# Patient Record
Sex: Female | Born: 1960 | Race: Black or African American | Hispanic: No | Marital: Single | Smoking: Former smoker
Health system: Southern US, Community
[De-identification: ages and names within clinical notes are randomized; demographics above are authoritative.]

## PROBLEM LIST (undated history)

## (undated) ENCOUNTER — Emergency Department: Discharge status: Absent without leave | Payer: Self-pay

## (undated) ENCOUNTER — Emergency Department: Admission: EM | Discharge status: Absent without leave | Payer: Self-pay

## (undated) DIAGNOSIS — I10 Essential (primary) hypertension: Secondary | ICD-10-CM

## (undated) HISTORY — PX: KNEE SURGERY: SHX244

## (undated) HISTORY — PX: CARPAL TUNNEL RELEASE: SHX101

---

## 2000-03-26 ENCOUNTER — Ambulatory Visit (HOSPITAL_BASED_OUTPATIENT_CLINIC_OR_DEPARTMENT_OTHER): Admission: RE | Admit: 2000-03-26 | Discharge: 2000-03-26 | Payer: Self-pay | Admitting: Orthopedic Surgery

## 2000-04-30 ENCOUNTER — Ambulatory Visit (HOSPITAL_BASED_OUTPATIENT_CLINIC_OR_DEPARTMENT_OTHER): Admission: RE | Admit: 2000-04-30 | Discharge: 2000-04-30 | Payer: Self-pay | Admitting: Orthopedic Surgery

## 2004-07-17 ENCOUNTER — Ambulatory Visit (HOSPITAL_BASED_OUTPATIENT_CLINIC_OR_DEPARTMENT_OTHER): Admission: RE | Admit: 2004-07-17 | Discharge: 2004-07-17 | Payer: Self-pay | Admitting: Orthopedic Surgery

## 2008-06-24 ENCOUNTER — Emergency Department (HOSPITAL_COMMUNITY): Admission: EM | Admit: 2008-06-24 | Discharge: 2008-06-24 | Payer: Self-pay | Admitting: Emergency Medicine

## 2010-07-03 LAB — DIFFERENTIAL
Basophils Relative: 0 % (ref 0–1)
Lymphocytes Relative: 38 % (ref 12–46)
Lymphs Abs: 3.4 10*3/uL (ref 0.7–4.0)
Monocytes Absolute: 0.5 10*3/uL (ref 0.1–1.0)
Monocytes Relative: 5 % (ref 3–12)
Neutro Abs: 4.9 10*3/uL (ref 1.7–7.7)
Neutrophils Relative %: 55 % (ref 43–77)

## 2010-07-03 LAB — POCT CARDIAC MARKERS
CKMB, poc: <1 ng/mL — ABNORMAL LOW (ref 1.0–8.0)
CKMB, poc: <1 ng/mL — ABNORMAL LOW (ref 1.0–8.0)
Myoglobin, poc: 66 ng/mL (ref 12–200)
Myoglobin, poc: 68.8 ng/mL (ref 12–200)
Troponin i, poc: <0.05 ng/mL (ref 0.00–0.09)

## 2010-07-03 LAB — CBC
Hemoglobin: 12.4 g/dL (ref 12.0–15.0)
MCHC: 33.4 g/dL (ref 30.0–36.0)
RBC: 4.43 MIL/uL (ref 3.87–5.11)
WBC: 9 10*3/uL (ref 4.0–10.5)

## 2010-07-03 LAB — BASIC METABOLIC PANEL
CO2: 27 mEq/L (ref 19–32)
Calcium: 9 mg/dL (ref 8.4–10.5)
Creatinine, Ser: 0.73 mg/dL (ref 0.4–1.2)
GFR calc Af Amer: >60 mL/min (ref >60)
Sodium: 140 mEq/L (ref 135–145)

## 2010-08-09 NOTE — Op Note (Signed)
[place]. [facility]  Patient:    Ann Dalton, Ann Dalton                      MRN: [id number] Proc. Date: 04/30/00 Adm. Date:  [id number] Attending:  Colbert Ewing                           Operative Report  PREOPERATIVE DIAGNOSIS:  Left carpal tunnel syndrome.  POSTOPERATIVE DIAGNOSIS:  Left carpal tunnel syndrome.  OPERATION PERFORMED:  Left carpal tunnel release.  SURGEON:  Loreta Ave, M.D.  ASSISTANT:  Arlys John D. Petrarca, P.A.-C.  ANESTHESIA:  IV regional.  SPECIMENS:  None.  CULTURES:  None.  COMPLICATIONS:  None.  DRESSING:  Soft compressive with bulky hand dressing and splint.  DESCRIPTION OF PROCEDURE:  The patient was brought to the operating room and placed on the operating table in supine position.  After adequate anesthesia had been obtained, left arm was prepped and draped in the usual sterile fashion.  Approached with a curved incision along the thenar eminence heading slightly ulnarward at the wrist crease.  Skin and subcutaneous tissues divided avoiding injury to the palmar branch of the median nerve.  Retinaculum over the carpal tunnel visualized and incised under direct visualization from the forearm fascia proximally to the palmar arch distally.  Median nerve inspected with typical hourglass constriction.  No other abnormalities within the carpal canal.  The wound was irrigated.  Motor branch digital branch identified and protected.  Everything completely decompressed.  Wound irrigated.  Skin closed with interrupted nylon.  Margins of the wound injected with Marcaine.  Sterile compressive dressing with bulky hand dressing and splint applied.  Anesthesia reversed.  Brought to recovery room.  Tolerated surgery well.  No complications. DD:  04/30/00 TD:  05/01/00 Job: [id number]

## 2010-08-09 NOTE — Op Note (Signed)
[place]. [facility]  Patient:    Ann Dalton, Ann Dalton                      MRN: [id number] Proc. Date: 03/26/00 Adm. Date:  [id number] Attending:  Colbert Ewing                           Operative Report  PREOPERATIVE DIAGNOSIS:  Right carpal tunnel syndrome.  POSTOPERATIVE DIAGNOSIS:  Right carpal tunnel syndrome.  OPERATION PERFORMED:  Right carpal tunnel release.  SURGEON:  Loreta Ave, M.D.  ASSISTANT:  Arlys John D. Petrarca, P.A.-C.  ANESTHESIA:  IV regional.  SPECIMENS:  None.  CULTURES:  None.  COMPLICATIONS:  None.  DRESSING:  Soft compressive with bulky hand dressing and splint.  DESCRIPTION OF PROCEDURE:  The patient was brought to the operating room and placed on the operating table in supine position. After adequate anesthesia had been obtained, the right arm was prepped and draped in the usual sterile fashion.  A curved incision along the thenar eminence heading slightly ulnarward at the wrist crease.  Skin and subcutaneous tissues divided. Retinaculum over the carpal tunnel identified and incised under direct visualization from the palmar arch distally to the forearm fascia proximally. Complete decompression of the ulnar nerve which had moderate hourglass constriction.  Thickened tenosynovium resected as well.  Motor branch, digital branches identified and decompressed, protected.  Wound irrigated.  Skin closed with interrupted nylon.  Margins of the wound injected with Marcaine. Sterile compressive dressing applied with bulky hand dressing and splint. Anesthesia reversed.  Brought to recovery room.  Tolerated surgery well.  No complications. DD:  03/26/00 TD:  03/26/00 Job: [id number]

## 2010-08-09 NOTE — Op Note (Signed)
Ann Dalton, Ann Dalton               ACCOUNT NO.:  [id number]   MEDICAL RECORD NO.:  [id number]          PATIENT TYPE:  AMB   LOCATION:  DSC                          FACILITY:  [facility]   PHYSICIAN:  Loreta Ave, M.D. DATE OF BIRTH:  1960/10/06   DATE OF PROCEDURE:  07/17/2004  DATE OF DISCHARGE:                                 OPERATIVE REPORT   PREOPERATIVE DIAGNOSIS:  Left knee bucket handle tear medial meniscus, with  a locked knee.   POSTOPERATIVE DIAGNOSIS:  Left knee bucket handle tear medial meniscus, with  a locked knee, with also some grade 3 chondromalacia, peak of the  patella  and lateral facet.  Mild focal grade 2 changes posterior aspect medial  femoral condyle.   PROCEDURE:  Left knee exam under anesthesia, arthroscopy, with partial  medial meniscectomy, chondroplasty patellofemoral joint and medial femoral  condyle.   SURGEON:  Loreta Ave, M.D.   ASSISTANT:  Genene Churn. Denton Meek.   ANESTHESIA:  General.   BLOOD LOSS:  Minimal.   SPECIMENS:  None.   CULTURES:  None.   COMPLICATIONS:  None.   DRESSINGS:  Soft compressive.   PROCEDURE:  The patient was brought to the operating room, and after  adequate anesthesia had been obtained, left leg was examined.  Noted to have  chronic duskiness and thickening of the skin and subcutaneous tissue from  below the knee down to the ankle.  Excellent vascular status of her foot.  The knee itself had a 2+ effusion, lacked full extension by 5 degrees  because of the displaced meniscus tear, but grossly stable ligaments.  Tourniquet and leg holder applied.  Leg prepped and draped in the usual  sterile fashion.  Tourniquet not employed.  Three portals were created, one  superolateral, and one in each medial and lateral parapatellar.  Inflow  catheter induced.  Knee distended.  Arthroscope introduced, and the knee  inspected.  Moderate amount of reactive synovitis throughout debrided.  Grade 2 and 3 changes lateral  patellar facet and on the peak, treated with  chondroplasty to a stable surface.  Good patellofemoral tracking and  stability.  Trochlea looked excellent.  Medial meniscus large bucket handle  tear with marked and intrameniscal tearing posterior two thirds displaced in  front of the condyle.  Not referable at all.  Resected leaving a rim of the  meniscus in the back, taper into remaining meniscus in the front.  There was  some scuffing medial femoral condyle posteromedially grade 2, treated with  chondroplasty.  Remaining articular cartilage looked good.  Cruciate  ligament was intact.  Lateral meniscus, lateral compartment was intact.  Entire knee examined, and no other findings appreciated.  Instruments and  fluid removed.  Portals and knee injected with Marcaine.  Portals closed  with 4-0 nylon.  Sterile compressive dressing applied.  Anesthesia reversed.  Brought to the recovery room.  Tolerated surgery well.  No complications.      DFM/MEDQ  D:  07/17/2004  T:  07/17/2004  Job:  [id number]

## 2011-03-20 ENCOUNTER — Emergency Department (INDEPENDENT_AMBULATORY_CARE_PROVIDER_SITE_OTHER)
Admission: EM | Admit: 2011-03-20 | Discharge: 2011-03-20 | Disposition: A | Discharge status: Home / Self Care | Payer: No Typology Code available for payment source | Source: Home / Self Care | Attending: Family Medicine | Admitting: Family Medicine

## 2011-03-20 ENCOUNTER — Emergency Department
Admit: 2011-03-20 | Discharge: 2011-03-20 | Disposition: A | Discharge status: Home / Self Care | Payer: No Typology Code available for payment source | Attending: Family Medicine | Admitting: Family Medicine

## 2011-03-20 ENCOUNTER — Encounter: Payer: Self-pay | Admitting: *Deleted

## 2011-03-20 DIAGNOSIS — S2341XA Sprain of ribs, initial encounter: Secondary | ICD-10-CM

## 2011-03-20 DIAGNOSIS — S29011A Strain of muscle and tendon of front wall of thorax, initial encounter: Secondary | ICD-10-CM

## 2011-03-20 HISTORY — DX: Essential (primary) hypertension: I10

## 2011-03-20 LAB — POCT URINALYSIS DIPSTICK
Ketones, UA: NEGATIVE
Protein, UA: NEGATIVE
Spec Grav, UA: 1.025 (ref 1.005–1.03)
Urobilinogen, UA: 0.2 (ref 0–1)
pH, UA: 6.5 (ref 5–8)

## 2011-03-20 MED ORDER — CYCLOBENZAPRINE HCL 10 MG PO TABS: ORAL_TABLET | ORAL | Status: AC

## 2011-03-20 NOTE — ED Notes (Signed)
Pt c/o RUQ abd/ rib pain x 3 days. She has taken Advil with some relief.

## 2011-03-20 NOTE — ED Provider Notes (Signed)
History     CSN: [id number]  Arrival date & time 03/20/11  9147   First MD Initiated Contact with Patient 03/20/11 1052      Chief Complaint  Patient presents with  . Flank Pain      HPI Comments: Patient complains of onset of squeezing pain in right flank/chest area four days ago while drinking beer.  The pain has been waxing and waning but is generally worse with chest movement and deep inspiration.  No recent URI or cough.  No lower leg pain.  No fevers, chills, and sweats.  No urinary symptoms.  Pain is not affected by eating.  No rash.   Patient is a 50 y.o. female presenting with flank pain. The history is provided by the patient.  Flank Pain This is a new problem. Episode onset: 4 days. The problem occurs constantly. The problem has not changed since onset.Associated symptoms include chest pain. Pertinent negatives include no abdominal pain and no shortness of breath. The symptoms are aggravated by twisting and coughing. The symptoms are relieved by NSAIDs. Treatments tried: Advil. The treatment provided moderate relief.    Past Medical History  Diagnosis Date  . Hypertension     Past Surgical History  Procedure Date  . Carpal tunnel release   . Knee surgery     Family History  Problem Relation Age of Onset  . Heart failure Mother   . Diabetes Sister   . Diabetes Brother     History  Substance Use Topics  . Smoking status: Former Smoker    Quit date: 03/20/2007  . Smokeless tobacco: Not on file  . Alcohol Use: Yes    OB History    Grav Para Term Preterm Abortions TAB SAB Ect Mult Living                  Review of Systems  Constitutional: Negative.   HENT: Negative.   Eyes: Negative.   Respiratory: Negative for cough, chest tightness, shortness of breath and wheezing.   Cardiovascular: Positive for chest pain.  Gastrointestinal: Negative.  Negative for abdominal pain.  Genitourinary: Positive for flank pain. Negative for dysuria, urgency, hematuria,  difficulty urinating and pelvic pain.  Musculoskeletal: Negative.   Skin: Negative.   Neurological: Negative.     Allergies  Review of patient's allergies indicates no known allergies.  Home Medications   Current Outpatient Rx  Name Route Sig Dispense Refill  . LISINOPRIL 10 MG PO TABS Oral Take 10 mg by mouth daily.      . CYCLOBENZAPRINE HCL 10 MG PO TABS  Take one by mouth at bedtime as needed for muscle pain 12 tablet 0    BP 151/84  Pulse 66  Temp(Src) 98.7 F (37.1 C) (Oral)  Resp 18  Ht 5' 4.5" (1.638 m)  Wt 300 lb 8 oz (136.306 kg)  BMI 50.78 kg/m2  SpO2 98%  LMP 02/18/2011  Physical Exam  Nursing note and vitals reviewed. Constitutional: She is oriented to person, place, and time. She appears well-developed and well-nourished. No distress.       Patient is morbidly obese (BMI 50.9)    HENT:  Head: Normocephalic.  Right Ear: External ear normal.  Left Ear: External ear normal.  Nose: Nose normal.  Mouth/Throat: Oropharynx is clear and moist.  Eyes: Conjunctivae are normal. Pupils are equal, round, and reactive to light.  Neck: Neck supple.  Cardiovascular: Regular rhythm and normal heart sounds.   Pulmonary/Chest: Effort normal and breath sounds  normal. No respiratory distress. She has no wheezes. She has no rales. She exhibits tenderness.  Abdominal: Soft. There is no tenderness.  Musculoskeletal: She exhibits no edema and no tenderness.       Arms:      There is tenderness to palpation over lower anterior/lateral and posterior ribs.  Posteriorly, tenderness extends to scapula.  Lymphadenopathy:    She has no cervical adenopathy.  Neurological: She is alert and oriented to person, place, and time.  Skin: Skin is warm and dry. No rash noted.  Psychiatric: She has a normal mood and affect.    ED Course  Procedures (including critical care time)   Labs Reviewed  POCT URINALYSIS DIPSTICK:  Large blood on dipstick.  Micro:  Rare RBC, many epithelial  cells, no WBC   Dg Chest 2 View  03/20/2011  *RADIOLOGY REPORT*  Clinical Data: Right-sided rib pain.  CHEST - 2 VIEW  Comparison: 06/24/2008.  Findings: Trachea is midline.  Heart size normal.  Lungs are clear. No pleural fluid.  IMPRESSION: No acute findings.  Original Report Authenticated By: Reyes Ivan, M.D.   Dg Ribs Unilateral Right  03/20/2011  *RADIOLOGY REPORT*  Clinical Data: Right rib pain.  RIGHT RIBS - 2 VIEW  Comparison: Chest radiograph 06/24/2008.  Findings: No evidence of acute rib fracture.  IMPRESSION: No evidence of an acute rib fracture.  Original Report Authenticated By: Reyes Ivan, M.D.     1. Intercostal muscle strain       MDM  Suspect musculoskeletal pain (likely intercostal strain; patient lifts heavy objects at work).  Could also be early herpes zoster. For now recommend Rib belt (or ace wrap) to chest daytime.  Flexeril at bedtime prn   Apply ice pack once or twice daily.  May take Ibuprofen 200mg , 4 tabs every 8 hours with food for 2 to 4 days. Call if rash develops (begin Valtrex)       Donna Christen, MD 03/20/11 [phone]

## 2014-03-31 ENCOUNTER — Other Ambulatory Visit (HOSPITAL_COMMUNITY): Payer: No Typology Code available for payment source

## 2014-04-12 ENCOUNTER — Inpatient Hospital Stay (HOSPITAL_COMMUNITY)
Admission: RE | Admit: 2014-04-12 | Payer: No Typology Code available for payment source | Source: Ambulatory Visit | Admitting: Orthopedic Surgery

## 2014-04-12 ENCOUNTER — Encounter (HOSPITAL_COMMUNITY): Admission: RE | Payer: Self-pay | Source: Ambulatory Visit

## 2014-04-12 SURGERY — ARTHROPLASTY, KNEE, TOTAL
Anesthesia: General | Site: Knee | Laterality: Right

## 2017-01-13 ENCOUNTER — Encounter (HOSPITAL_COMMUNITY): Payer: Self-pay

## 2017-01-13 ENCOUNTER — Emergency Department (HOSPITAL_COMMUNITY)
Admission: EM | Admit: 2017-01-13 | Discharge: 2017-01-13 | Disposition: A | Discharge status: Home / Self Care | Payer: Self-pay | Attending: Emergency Medicine | Admitting: Emergency Medicine

## 2017-01-13 ENCOUNTER — Emergency Department (HOSPITAL_COMMUNITY): Payer: Self-pay

## 2017-01-13 DIAGNOSIS — W07XXXA Fall from chair, initial encounter: Secondary | ICD-10-CM | POA: Insufficient documentation

## 2017-01-13 DIAGNOSIS — M47816 Spondylosis without myelopathy or radiculopathy, lumbar region: Secondary | ICD-10-CM | POA: Insufficient documentation

## 2017-01-13 DIAGNOSIS — Z79899 Other long term (current) drug therapy: Secondary | ICD-10-CM | POA: Insufficient documentation

## 2017-01-13 DIAGNOSIS — M1711 Unilateral primary osteoarthritis, right knee: Secondary | ICD-10-CM | POA: Insufficient documentation

## 2017-01-13 DIAGNOSIS — Y9259 Other trade areas as the place of occurrence of the external cause: Secondary | ICD-10-CM | POA: Insufficient documentation

## 2017-01-13 DIAGNOSIS — I10 Essential (primary) hypertension: Secondary | ICD-10-CM | POA: Insufficient documentation

## 2017-01-13 DIAGNOSIS — W19XXXA Unspecified fall, initial encounter: Secondary | ICD-10-CM

## 2017-01-13 DIAGNOSIS — Y939 Activity, unspecified: Secondary | ICD-10-CM | POA: Insufficient documentation

## 2017-01-13 DIAGNOSIS — Y990 Civilian activity done for income or pay: Secondary | ICD-10-CM | POA: Insufficient documentation

## 2017-01-13 DIAGNOSIS — S82091A Other fracture of right patella, initial encounter for closed fracture: Secondary | ICD-10-CM | POA: Insufficient documentation

## 2017-01-13 DIAGNOSIS — Z87891 Personal history of nicotine dependence: Secondary | ICD-10-CM | POA: Insufficient documentation

## 2017-01-13 MED ORDER — MORPHINE SULFATE (PF) 4 MG/ML IV SOLN
4.0000 mg | Freq: Once | INTRAVENOUS | Status: AC
  Administered 2017-01-13: 4 mg via INTRAVENOUS
  Filled 2017-01-13: qty 1

## 2017-01-13 MED ORDER — HYDROCODONE-ACETAMINOPHEN 5-325 MG PO TABS: 1.0000 | ORAL_TABLET | Freq: Four times a day (QID) | ORAL | 0 refills | Status: AC | PRN

## 2017-01-13 MED ORDER — NAPROXEN 500 MG PO TABS: 500.0000 mg | ORAL_TABLET | Freq: Two times a day (BID) | ORAL | 0 refills | Status: AC | PRN

## 2017-01-13 NOTE — ED Notes (Signed)
Mercedes, PA at bedside. 

## 2017-01-13 NOTE — ED Notes (Signed)
Ortho tech called 

## 2017-01-13 NOTE — ED Triage Notes (Signed)
[facility] from work where pt reports having a fall from a rolling chair. Pt landed on her knees, denies hitting her head, not on blood thinners. Right knee tender to the touch for EMS, they reports strong pulses, swelling in right knee, splinted RLE.

## 2017-01-13 NOTE — Progress Notes (Signed)
Orthopedic Tech Progress Note Patient Details:  Ann HinesCarol P [name] October 05, 1960 [id number]  Ortho Devices Type of Ortho Device: Knee Immobilizer, Crutches Ortho Device/Splint Location: Right/  application of knee immobilizer was tolerated very well.  fitted pt for crutches and ambulated pt with crutches.  pt ambulated fair.   Right knee Ortho Device/Splint Interventions: Application, Adjustment   Alvina ChouWilliams, Bernice Mcauliffe C 01/13/2017, 8:12 PM

## 2017-01-13 NOTE — Discharge Instructions (Signed)
Wear knee immobilizer at all times until you see the orthopedist. Use crutches for all weight bearing activities. Ice and elevate knee throughout the day, using ice pack for no more than 20 minutes every hour. Alternate between naprosyn and norco for pain relief. Do not drive or operate machinery with pain medication use. Follow up with your orthopedist in 5-7 days for recheck of symptoms and ongoing management of your knee injury. Return to the ER for changes or worsening symptoms.

## 2017-01-13 NOTE — ED Notes (Signed)
Pt. returned from XR. 

## 2017-01-13 NOTE — ED Notes (Signed)
Patient transported to X-ray 

## 2017-01-13 NOTE — ED Notes (Signed)
Pt assisted to restroom with female urinal. Pt pulled into empty treatment room to maintain privacy. Remainder of pt jeans cut off, placed in belongings bag, and given to pt.

## 2017-01-13 NOTE — ED Notes (Signed)
Ortho tech at the bedside.  

## 2017-01-13 NOTE — ED Provider Notes (Signed)
[facility] Provider Note   CSN: [id number] Arrival date & time: 01/13/17  1631     History   Chief Complaint Chief Complaint  Patient presents with  . Knee Pain  . Fall    HPI Ann HinesCarol P Dalton is a 56 y.o. female with a PMHx of HTN, who presents to the ED with complaints of mechanical fall at work. Patient states that she was attempting to sit on a rolling chair at her workstation around 2 PM when the chair slipped out from underneath her, causing her to fall onto both knees on the cement floor, landing more on the right knee. She describes the pain is 8/10 constant burning and sharp nonradiating right knee pain, worse with movement, and moderately improved with fentanyl given in route. She also reports associated right knee swelling right greater than left. She did not hit her head or lose consciousness, she is not on any blood thinners. She has previously seen Dr. Eulah PontMurphy (Sr) for orthopedic care. Her PCP is Dr[facility] Maryellen [facility]helburne [facility]. She denies any abrasions, CP, SOB, abdominal pain, nausea, vomiting, neck or back pain, incontinence of urine or stool, saddle anesthesia or cauda equina symptoms, numbness, tingling, focal weakness, head inj, LOC, other areas of pain, bruises, or any other complaints or injuries at this time.   The history is provided by the patient and medical records. No language interpreter was used.  Knee Pain   This is a new problem. The current episode started 3 to 5 hours ago. The problem occurs constantly. The problem has not changed since onset.The pain is present in the right knee. The quality of the pain is described as sharp (and burning). The pain is at a severity of 8/10. The pain is moderate. Associated symptoms include limited range of motion (due to pain). Pertinent negatives include no numbness and no tingling. The symptoms are aggravated by activity. Treatments tried: fentanyl. The treatment provided  moderate relief. There has been a history of trauma.  Fall  Pertinent negatives include no chest pain, no abdominal pain and no shortness of breath.    Past Medical History:  Diagnosis Date  . Hypertension     There are no active problems to display for this patient.   Past Surgical History:  Procedure Laterality Date  . CARPAL TUNNEL RELEASE    . KNEE SURGERY      OB History    No data available       Home Medications    Prior to Admission medications   Medication Sig Start Date End Date Taking? Authorizing Provider  cyclobenzaprine (FLEXERIL) 10 MG tablet Take one by mouth at bedtime as needed for muscle pain 03/20/11   Lattie HawBeese, Stephen A, MD  lisinopril (PRINIVIL,ZESTRIL) 10 MG tablet Take 10 mg by mouth daily.      [provider]    Family History Family History  Problem Relation Age of Onset  . Heart failure Mother   . Diabetes Sister   . Diabetes Brother     Social History Social History  Substance Use Topics  . Smoking status: Former Smoker    Quit date: 03/20/2007  . Smokeless tobacco: Never Used  . Alcohol use Yes     Allergies   Patient has no known allergies.   Review of Systems Review of Systems  HENT: Negative for facial swelling (no head inj).   Respiratory: Negative for shortness of breath.   Cardiovascular: Negative for chest pain.  Gastrointestinal: Negative for abdominal pain, nausea and vomiting.  Genitourinary: Negative for difficulty urinating (no incontinence).  Musculoskeletal: Positive for arthralgias and joint swelling. Negative for back pain and neck pain.  Skin: Negative for color change and wound.  Allergic/Immunologic: Negative for immunocompromised state.  Neurological: Negative for tingling, syncope, weakness and numbness.  Hematological: Does not bruise/bleed easily.  Psychiatric/Behavioral: Negative for confusion.   All other systems reviewed and are negative for acute change except as noted in the HPI.     Physical Exam Updated Vital Signs BP 123/72 (BP Location: Right Wrist)   Pulse 69   Temp 98.3 F (36.8 C) (Oral)   Resp 18   Ht 5\' 4"  (1.626 m)   Wt 133.8 kg (295 lb)   SpO2 98%   BMI 50.64 kg/m   Physical Exam  Constitutional: She is oriented to person, place, and time. Vital signs are normal. She appears well-developed and well-nourished.  Non-toxic appearance. No distress.  Afebrile, nontoxic, NAD  HENT:  Head: Normocephalic and atraumatic.  Mouth/Throat: Mucous membranes are normal.  Eyes: Conjunctivae and EOM are normal. Right eye exhibits no discharge. Left eye exhibits no discharge.  Neck: Normal range of motion. Neck supple. No spinous process tenderness and no muscular tenderness present. No neck rigidity. Normal range of motion present.  FROM intact without spinous process TTP, no bony stepoffs or deformities, no paraspinous muscle TTP or muscle spasms. No rigidity or meningeal signs. No bruising or swelling.   Cardiovascular: Normal rate and intact distal pulses.   Pulmonary/Chest: Effort normal. No respiratory distress.  Abdominal: Normal appearance. She exhibits no distension.  Musculoskeletal:       Right hip: She exhibits tenderness. She exhibits normal range of motion, no swelling, no crepitus and no deformity.       Left hip: Normal.       Right knee: She exhibits decreased range of motion (due to pain) and swelling. She exhibits no ecchymosis, no deformity, no laceration, no erythema, normal alignment, no LCL laxity, normal patellar mobility and no MCL laxity. Tenderness found.       Left knee: Normal. No tenderness found.       Right ankle: Normal.       Cervical back: Normal.       Thoracic back: Normal.       Lumbar back: Normal.  C-spine as above, all other spinal levels nonTTP without bony stepoffs or deformities R hip with FROM intact, with mild lateral joint line TTP, no thigh tenderness, no bruising or swelling, no crepitus or deformity, no limb length  discrepancy or abnormal rotation. No pain with log roll testing.  L hip with FROM intact and no focal tenderness to hip or thigh.   R knee with mildly limited ROM due to pain, with mild prepatellar TTP, +prepatellar swelling but no significant joint effusion, no deformity, no bruising or erythema, no warmth, no abnormal alignment or patellar mobility, no varus/valgus laxity, neg anterior drawer test, no crepitus. Remainder of RLE without evidence of trauma/injury and no focal area of tenderness. L knee with FROM intact, no focal tenderness, no swelling/effusion/deformity/crepitus, no overlying skin changes, no laxity. Remainder of LLE without evidence of trauma or injury, no other areas of tenderness. Distal strength and sensation grossly intact, distal pulses intact, compartments soft   Neurological: She is alert and oriented to person, place, and time. She has normal strength. No sensory deficit.  Skin: Skin is warm, dry and intact. No rash noted.  Psychiatric: She  has a normal mood and affect. Her behavior is normal.  Nursing note and vitals reviewed.    ED Treatments / Results  Labs (all labs ordered are listed, but only abnormal results are displayed) Labs Reviewed - No data to display  EKG  EKG Interpretation None       Radiology Dg Knee Complete 4 Views Right  Result Date: 01/13/2017 CLINICAL DATA:  56 year old who fell from a rolling chair while at work earlier today, landing on her knees. Right knee pain and swelling. Right upper leg pain. Initial encounter. EXAM: RIGHT KNEE - COMPLETE 4+ VIEW COMPARISON:  None. FINDINGS: Prepatellar soft tissue swelling. Possible avulsion fracture arising from the superior patella at the insertion of the quadriceps tendon. No fractures elsewhere. Mild patella alta. Moderate to severe medial compartment joint space narrowing with associated spurring. Mild lateral compartment joint space narrowing. Well-preserved bone mineral density. IMPRESSION:  1. Possible avulsion fracture arising from the superior patella at the insertion of the quadriceps tendon. Please correlate with point tenderness. If the patient is not focally tender, then this represents calcification at the insertion of the tendon. 2. No fractures elsewhere. 3. Moderate to severe medial compartment osteoarthritis. Mild lateral compartment osteoarthritis. Electronically Signed   By: Hulan Saas M.D.   On: 01/13/2017 18:28   Dg Hip Unilat W Or Wo Pelvis 2-3 Views Right  Result Date: 01/13/2017 CLINICAL DATA:  56 year old who fell from a rolling chair while at work earlier today, landing on her knees. Right knee pain and swelling. Right upper leg pain. Initial encounter. EXAM: DG HIP (WITH OR WITHOUT PELVIS) 2-3V RIGHT COMPARISON:  None. FINDINGS: No evidence of acute fracture or dislocation. Well-preserved joint space. Well-preserved bone mineral density. Included AP pelvis demonstrates a normal-appearing contralateral left hip joint. Sacroiliac joints and symphysis pubis intact. Degenerative changes involving the visualized lower lumbar spine. Dystrophic calcifications in the left side of the pelvis, possibly degenerated fibroids. IMPRESSION: 1. Normal appearing right hip. 2. Degenerative changes involving the lower lumbar spine. Electronically Signed   By: Hulan Saas M.D.   On: 01/13/2017 18:23    Procedures Procedures (including critical care time)  Medications Ordered in ED Medications  morphine 4 MG/ML injection 4 mg (4 mg Intravenous Given 01/13/17 1740)     Initial Impression / Assessment and Plan / ED Course  I have reviewed the triage vital signs and the nursing notes.  Pertinent labs & imaging results that were available during my care of the patient were reviewed by me and considered in my medical decision making (see chart for details).     56 y.o. female here with mechanical fall while attempting to sit in a rolling chair and it went out from under  her, landing on b/l knees but more on R side. On exam, mild prepatellar swelling, diffuse tenderness to R knee joint line, mild tenderness to R hip as well, neg log roll testing, no other areas of tenderness to either leg, no midline spinal tenderness, NVI with soft compartments. Will get xray of R hip and knee, give pain meds, and reassess shortly.   7:20 PM Pain improved after morphine. Xray R hip negative for acute findings, mild degenerative findings of lumbar spine noted. Xray R knee with avulsion fx of superior patella at insertion of quads tendon, which is fairly close to where the pain is on exam; also with moderate to severe medial compartment OA and mild lateral compartment OA. Will apply knee immobilizer and give crutches for all  weight bearing activities until she sees ortho, advised RICE, will rx pain meds, and f/up with ortho in 5-7 days for ongoing management of her injury. [place] attempted to be reviewed prior to dispensing controlled substance medications, however is not currently functioning and is therefore unable to give any results back. Risks/benefits/alternatives and expectations discussed regarding controlled substances. Side effects of medications discussed. Informed consent obtained. I explained the diagnosis and have given explicit precautions to return to the ER including for any other new or worsening symptoms. The patient understands and accepts the medical plan as it's been dictated and I have answered their questions. Discharge instructions concerning home care and prescriptions have been given. The patient is STABLE and is discharged to home in good condition.    Final Clinical Impressions(s) / ED Diagnoses   Final diagnoses:  Other closed fracture of right patella, initial encounter  Osteoarthritis of right knee, unspecified osteoarthritis type  Fall, initial encounter  Osteoarthritis of lumbar spine, unspecified spinal osteoarthritis complication status     New Prescriptions New Prescriptions   HYDROCODONE-ACETAMINOPHEN (NORCO) 5-325 MG TABLET    Take 1-2 tablets by mouth every 6 (six) hours as needed for severe pain.   NAPROXEN (NAPROSYN) 500 MG TABLET    Take 1 tablet (500 mg total) by mouth 2 (two) times daily as needed for mild pain, moderate pain or headache (TAKE WITH MEALS.).     [address], [place], [place] 01/13/17 1929    Vanetta Mulders, MD 01/15/17 [phone]

## 2017-03-19 ENCOUNTER — Other Ambulatory Visit: Payer: Self-pay | Admitting: Orthopedic Surgery

## 2017-03-19 DIAGNOSIS — M25561 Pain in right knee: Secondary | ICD-10-CM

## 2017-03-31 ENCOUNTER — Ambulatory Visit
Admission: RE | Admit: 2017-03-31 | Discharge: 2017-03-31 | Disposition: A | Discharge status: Home / Self Care | Payer: Worker's Compensation | Source: Ambulatory Visit | Attending: Orthopedic Surgery | Admitting: Orthopedic Surgery

## 2017-03-31 DIAGNOSIS — M25561 Pain in right knee: Secondary | ICD-10-CM

## 2018-03-11 ENCOUNTER — Encounter (INDEPENDENT_AMBULATORY_CARE_PROVIDER_SITE_OTHER): Payer: Self-pay

## 2018-03-31 ENCOUNTER — Ambulatory Visit (INDEPENDENT_AMBULATORY_CARE_PROVIDER_SITE_OTHER): Payer: Self-pay | Admitting: Family Medicine

## 2018-04-14 ENCOUNTER — Ambulatory Visit (INDEPENDENT_AMBULATORY_CARE_PROVIDER_SITE_OTHER): Payer: Self-pay | Admitting: Family Medicine

## 2018-07-19 IMAGING — MR MR KNEE*R* W/O CM
6 series · 36 of 40 positions shown · non-contrast
Comparison: Radiographs 01/13/2017

CLINICAL DATA: 56-year-old with right anterior knee pain and
weakness since falling 2.5 months ago. No previous relevant surgery.

EXAM:
MRI OF THE RIGHT KNEE WITHOUT CONTRAST
TECHNIQUE: Multiplanar, multisequence MR imaging of the knee was performed. No
intravenous contrast was administered.

[Series 9: PD fat-sat · axial · right · 4.0mm · 0.47mm/px · z∈[+9,+123]mm · 5 of 27 slices shown (1 of 3)]
[im 1/27]
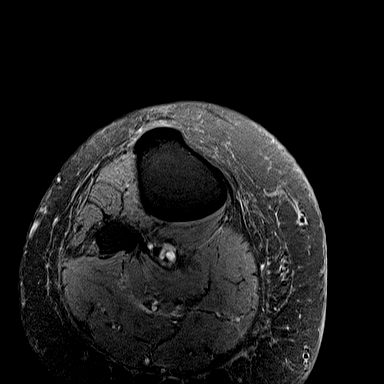
[im 7/27]
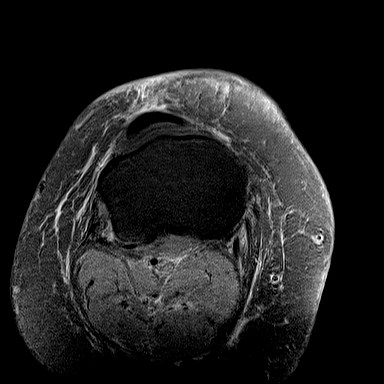
[im 14/27]
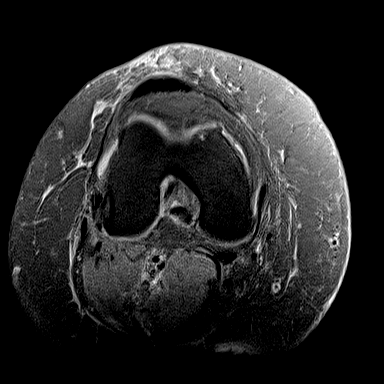
[im 20/27]
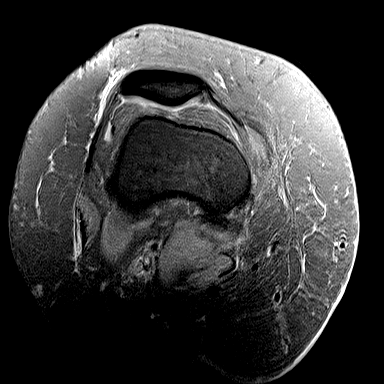
[im 27/27]
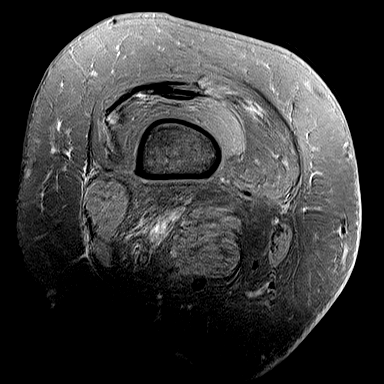

[Series 10: T1 · coronal · right · 3.0mm · 0.47mm/px · 4 of 34 slices shown]
[im 1/34]
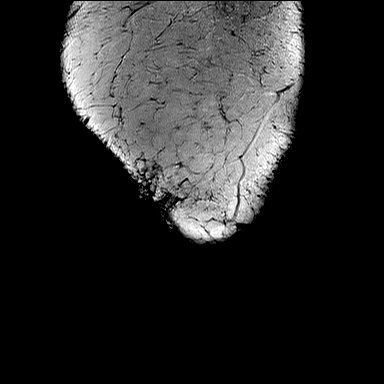
[im 5/34]
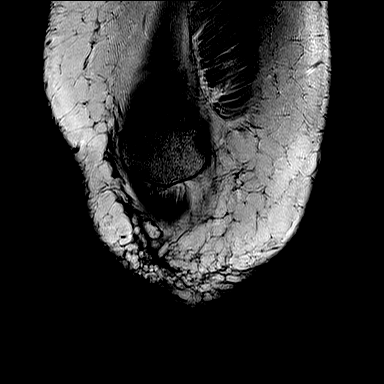
[im 10/34]
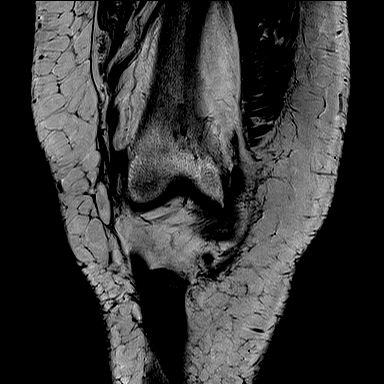
[im 15/34]
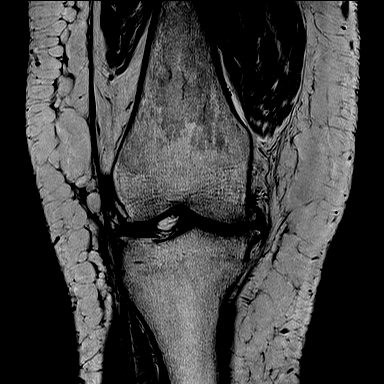

[Series 11: PD fat-sat · sagittal · right · 3.2mm · 0.56mm/px · 6 of 29 slices shown (2 of 3)]
[im 1/29]
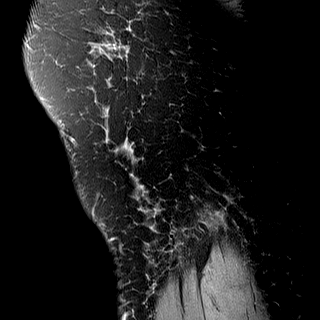
[im 6/29]
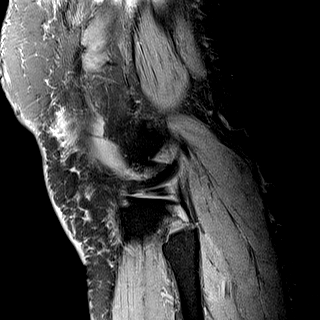
[im 12/29]
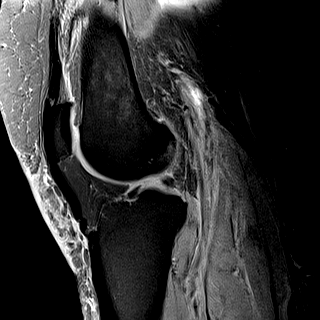
[im 17/29]
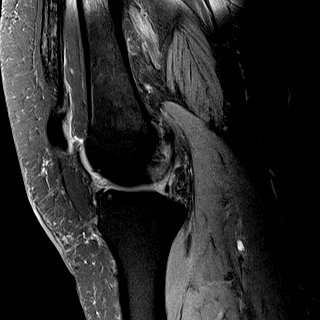
[im 23/29]
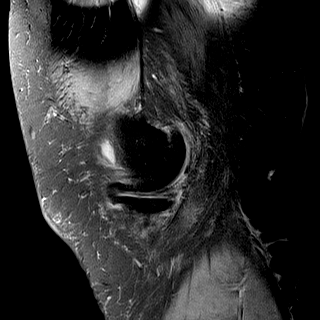
[im 29/29]
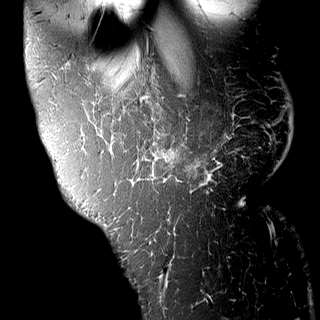

[Series 12: PD fat-sat · coronal · right · 3.0mm · 0.47mm/px · 8 of 34 slices shown (3 of 3)]
[im 1/34]
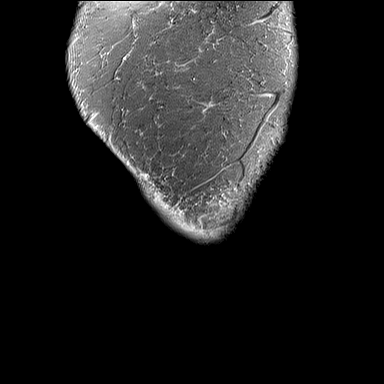
[im 5/34]
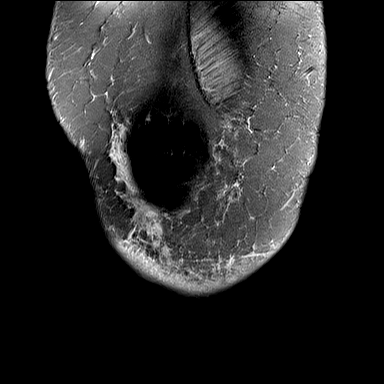
[im 10/34]
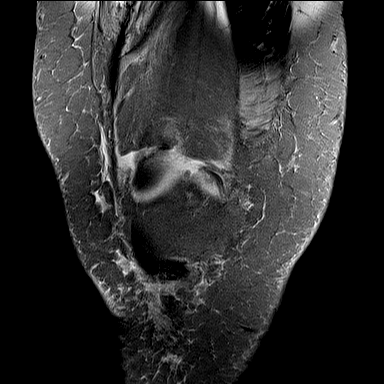
[im 15/34]
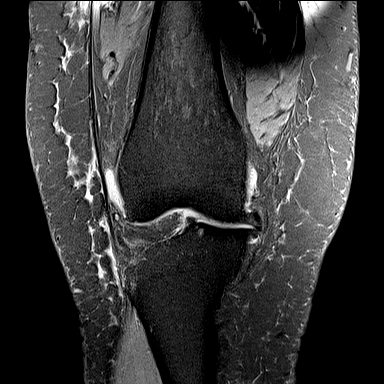
[im 19/34]
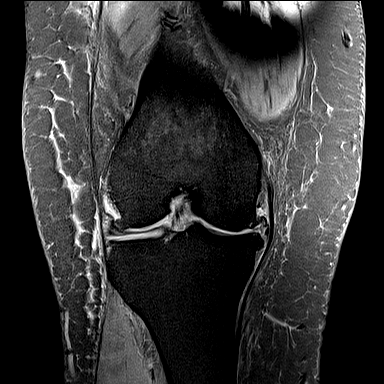
[im 24/34]
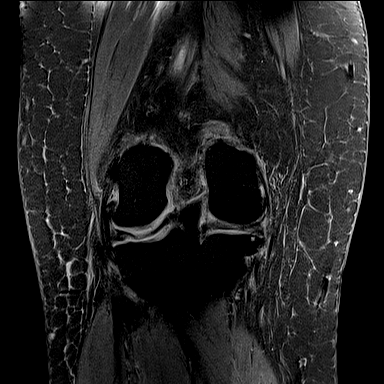
[im 29/34]
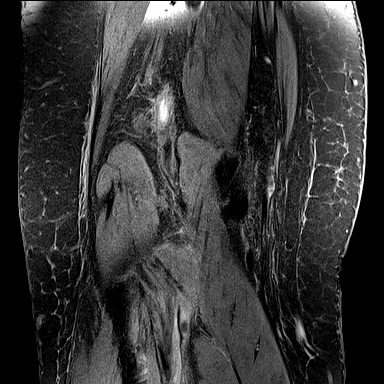
[im 34/34]
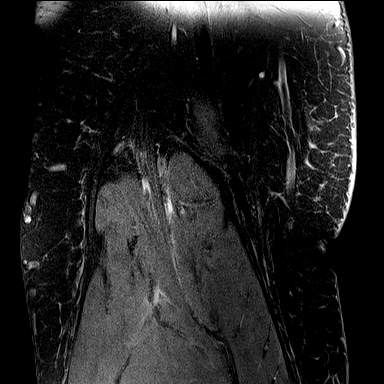

[Series 13: T2 fat-sat · coronal · right · 3.0mm · 0.47mm/px · 8 of 34 slices shown]
[im 1/34]
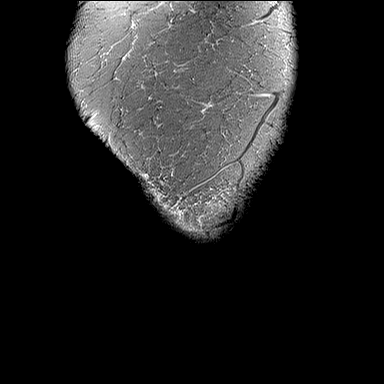
[im 5/34]
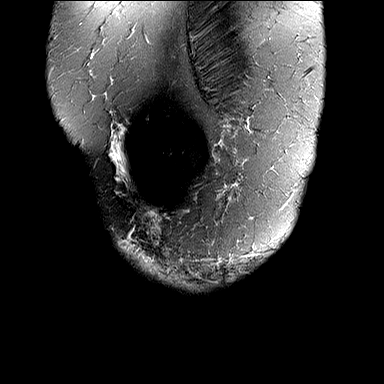
[im 10/34]
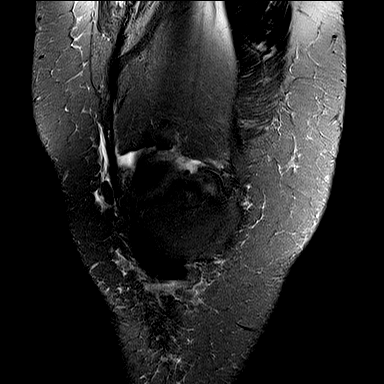
[im 15/34]
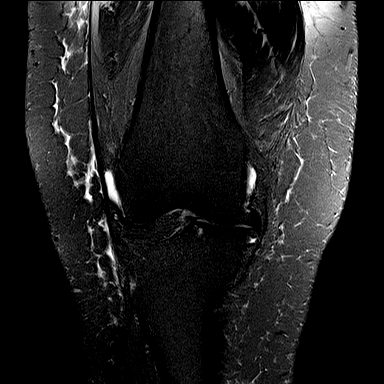
[im 19/34]
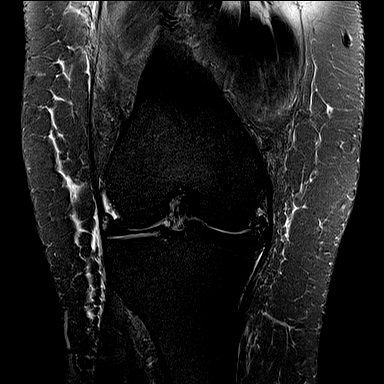
[im 24/34]
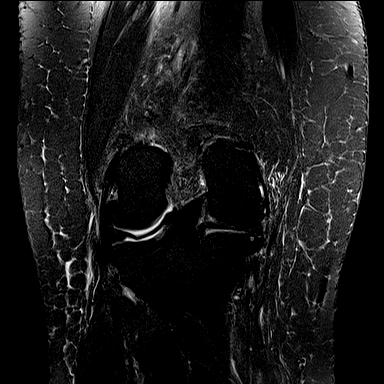
[im 29/34]
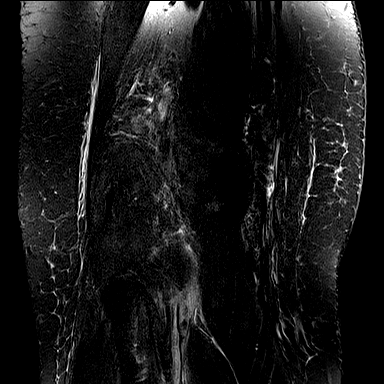
[im 34/34]
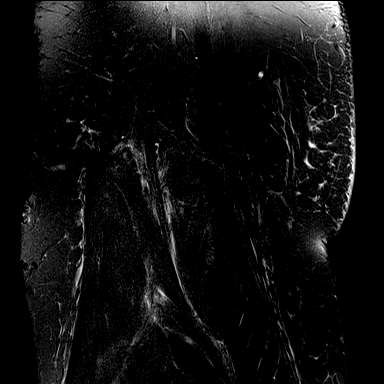

[Series 14: PD · coronal · right · 1.5mm · 0.44mm/px · 5 of 21 slices shown]
[im 1/21]
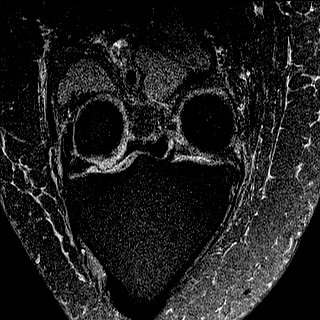
[im 6/21]
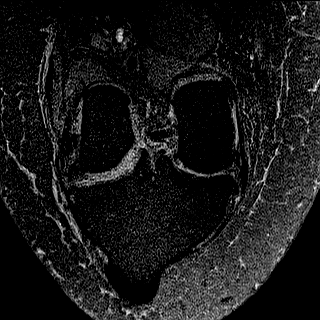
[im 11/21]
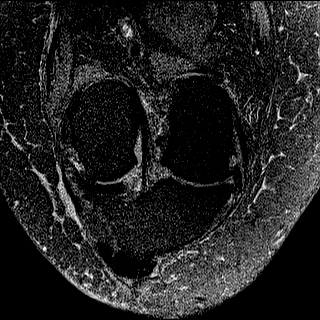
[im 16/21]
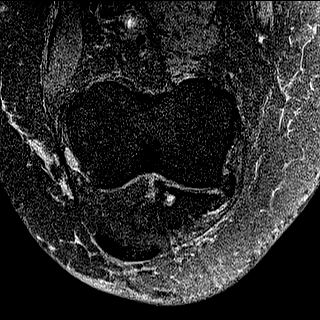
[im 21/21]
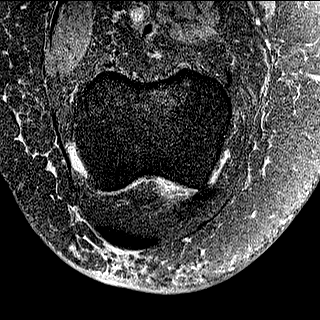

[36 of 40 positions shown; findings below may reference images not displayed]

FINDINGS: MENISCI

Medial meniscus: Diffuse degenerative free edge tearing of the
posterior horn and body. The meniscus is partially extruded
peripherally from the joint. The meniscal root is intact.

Lateral meniscus: Mild free edge fraying and meniscal flounce
artifact. No discrete tear.

LIGAMENTS

Cruciates:  Intact.

Collaterals: Intact. There is MCL degeneration and medial buckling
related to medial compartment osteoarthritis and meniscal extrusion.

CARTILAGE

Patellofemoral: Preserved patellar cartilage. There is medial
trochlear chondral thinning and subchondral cyst formation.

Medial: Advanced chondral thinning with subchondral eburnation and
marginal osteophyte formation.

Lateral:  Mild chondral thinning and osteophyte formation.

MISCELLANEOUS

Joint:  Minimal joint fluid.  No loose bodies observed.

Popliteal Fossa: Unremarkable. No significant Baker's cyst. Probable
slow flow in the popliteal vein.

Extensor Mechanism:  Intact.  There is a mild patella alta.

Bones:  No acute or significant extra-articular osseous findings.

Other: Mild subcutaneous edema, greatest anterolaterally.
IMPRESSION: 1. Tricompartmental osteoarthritis, greatest in the medial
compartment. No acute osseous findings.
2. Degenerative free edge tearing of the posterior horn and body of
the medial meniscus.
3. No acute ligamentous findings.
